# Patient Record
Sex: Male | Born: 1995 | Race: White | Hispanic: No | Marital: Single | Smoking: Current every day smoker
Health system: Southern US, Community
[De-identification: ages and names within clinical notes are randomized; demographics above are authoritative.]

---
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
## 2016-06-07 ENCOUNTER — Emergency Department (HOSPITAL_COMMUNITY)
Admission: EM | Admit: 2016-06-07 | Discharge: 2016-06-07 | Disposition: A | Discharge status: Home / Self Care | Payer: 59 | Attending: Emergency Medicine | Admitting: Emergency Medicine

## 2016-06-07 ENCOUNTER — Encounter (HOSPITAL_COMMUNITY): Payer: Self-pay | Admitting: *Deleted

## 2016-06-07 ENCOUNTER — Emergency Department (HOSPITAL_COMMUNITY): Payer: 59

## 2016-06-07 DIAGNOSIS — Z79899 Other long term (current) drug therapy: Secondary | ICD-10-CM | POA: Insufficient documentation

## 2016-06-07 DIAGNOSIS — F909 Attention-deficit hyperactivity disorder, unspecified type: Secondary | ICD-10-CM | POA: Insufficient documentation

## 2016-06-07 DIAGNOSIS — J45909 Unspecified asthma, uncomplicated: Secondary | ICD-10-CM | POA: Diagnosis not present

## 2016-06-07 DIAGNOSIS — S61211A Laceration without foreign body of left index finger without damage to nail, initial encounter: Secondary | ICD-10-CM | POA: Insufficient documentation

## 2016-06-07 DIAGNOSIS — Y929 Unspecified place or not applicable: Secondary | ICD-10-CM | POA: Diagnosis not present

## 2016-06-07 DIAGNOSIS — M79645 Pain in left finger(s): Secondary | ICD-10-CM

## 2016-06-07 DIAGNOSIS — Y999 Unspecified external cause status: Secondary | ICD-10-CM | POA: Diagnosis not present

## 2016-06-07 DIAGNOSIS — S61219A Laceration without foreign body of unspecified finger without damage to nail, initial encounter: Secondary | ICD-10-CM

## 2016-06-07 DIAGNOSIS — Y939 Activity, unspecified: Secondary | ICD-10-CM | POA: Diagnosis not present

## 2016-06-07 DIAGNOSIS — W311XXA Contact with metalworking machines, initial encounter: Secondary | ICD-10-CM | POA: Insufficient documentation

## 2016-06-07 MED ORDER — DOXYCYCLINE HYCLATE 100 MG PO CAPS: 100.0000 mg | ORAL_CAPSULE | Freq: Two times a day (BID) | ORAL | 0 refills | Status: DC

## 2016-06-07 NOTE — ED Triage Notes (Signed)
Pt reports cutting left index finger on hedger two days ago, then tried to superglue his finger at home. Now still has swelling and pain to finger, no redness or warmth noted.

## 2016-06-07 NOTE — ED Notes (Signed)
Pt has superficial laceration to left index finger with hedge trimmer, 2 days ago. States he "glued it together with superglue".

## 2016-06-07 NOTE — ED Provider Notes (Signed)
[facility] Provider Note   CSN: [id number] Arrival date & time: 06/07/16  1605  By signing my name below, I, Clovis Pu, attest that this documentation has been prepared under the direction and in the presence of  Fordyce Lepak Camprubi-Soms, PA-C. Electronically Signed: Clovis Pu, ED Scribe. 06/07/16. 5:00 PM.    History   Chief Complaint Chief Complaint  Patient presents with  . Laceration    The history is provided by the patient and medical records. No language interpreter was used.  Laceration   The incident occurred 2 days ago. The laceration is located on the left hand. The laceration is 1 cm in size. The laceration mechanism was a a metal edge. The pain is at a severity of 7/10. The pain is mild. The pain has been intermittent since onset. He reports no foreign bodies present. His tetanus status is UTD.   HPI Comments:  Dustin Kline is a 20 y.o. male who presents to the Emergency Department complaining of a L index finger lac that he sustained 2 days ago on an Publishing rights manager (metal edge). He complaints of pain, which he describes as 7/10 intermittent, throbbing nonradiating L index finger pain worse with extending his knuckle and relieved by Advil. He superglued it initially, but today it spread open a little and has oozed some minimal blood from the wound only when he spreads it open. Associated symptoms include swelling and bruising to his finger. He denies redness, warmth, wound drainage, fevers, chills, CP, SOB, abd pain, N/V/D/C, hematuria, dysuria, myalgias, numbness, tingling, weakness, or any other symptoms.  Tetanus is UTD in last 3 yrs.   Past Medical History:  Diagnosis Date  . Asthma   . Attention deficit disorder     Patient Active Problem List   Diagnosis Date Noted  . ATTENTION DEFICIT, W/HYPERACTIVITY 11/14/2006  . RHINITIS, ALLERGIC 11/14/2006  . ASTHMA, UNSPECIFIED 11/14/2006    History reviewed. No pertinent surgical  history.     Home Medications    Prior to Admission medications   Medication Sig Start Date End Date Taking? Authorizing Provider  albuterol (PROVENTIL HFA;VENTOLIN HFA) 108 (90 BASE) MCG/ACT inhaler Inhale 2 puffs into the lungs every 6 (six) hours as needed for wheezing or shortness of breath. 10/27/13   Ethelda Chick, MD  ARIPiprazole (ABILIFY) 20 MG tablet Take one tablet by mouth daily 11/23/11   Pattricia Boss, PA-C  atomoxetine (STRATTERA) 40 MG capsule Take 1 capsule (40 mg total) by mouth daily. 10/28/11 10/27/12  Elvina Sidle, MD  azithromycin (ZITHROMAX) 250 MG tablet Take 2 tablets the first day and then 1 tablet daily for 4 days. 10/27/13   Ethelda Chick, MD  predniSONE (DELTASONE) 20 MG tablet Two tablets daily x 5 days, then one tablet daily x 3 days 10/27/13   Ethelda Chick, MD    Family History Family History  Problem Relation Age of Onset  . Arthritis Mother     Social History Social History  Substance Use Topics  . Smoking status: Never Smoker  . Smokeless tobacco: Never Used  . Alcohol use No     Allergies   Divalproex sodium   Review of Systems Review of Systems  Constitutional: Negative for chills and fever.  Respiratory: Negative for shortness of breath.   Cardiovascular: Negative for chest pain.  Gastrointestinal: Negative for abdominal pain, constipation, diarrhea, nausea and vomiting.  Genitourinary: Negative for dysuria.  Musculoskeletal: Positive for arthralgias and joint swelling. Negative for myalgias.  Skin: Positive for color change (bruised, no redness/warmth) and wound.  Allergic/Immunologic: Negative for immunocompromised state.  Neurological: Negative for weakness and numbness.  Psychiatric/Behavioral: Negative for confusion.  10 Systems reviewed and are negative for acute change except as noted in the HPI.    Physical Exam Updated Vital Signs BP 127/78   Pulse 78   Temp 98.3 F (36.8 C)   Resp 20   Wt 231 lb 3 oz (104.9  kg)   SpO2 99%   Physical Exam  Constitutional: He is oriented to person, place, and time. Vital signs are normal. He appears well-developed and well-nourished.  Non-toxic appearance. No distress.  Afebrile, nontoxic, NAD  HENT:  Head: Normocephalic and atraumatic.  Mouth/Throat: Mucous membranes are normal.  Eyes: Conjunctivae and EOM are normal. Right eye exhibits no discharge. Left eye exhibits no discharge.  Neck: Normal range of motion. Neck supple.  Cardiovascular: Normal rate and intact distal pulses.   Pulmonary/Chest: Effort normal. No respiratory distress.  Abdominal: Normal appearance. He exhibits no distension.  Musculoskeletal: Normal range of motion.       Left hand: He exhibits tenderness, bony tenderness, laceration and swelling. He exhibits normal range of motion, normal capillary refill and no deformity. Normal sensation noted. Normal strength noted.  L index finger with mild TTP to the PIP joint, mild swelling and bruising to this area, with FROM intact at MCP/PIP/DIP joints, with superficial ~1cm linear laceration to the volar aspect of the PIP joint, no ongoing bleeding, no drianage, no surrounding erythema or warmth. No deformity or crepitus. Sensation and strength grossly intact, distal pulses intact, cap refill brisk and present. SEE PICTURE BELOW.   Neurological: He is alert and oriented to person, place, and time. He has normal strength. No sensory deficit.  Skin: Skin is dry. Laceration noted. No rash noted.  Left index finger laceration as noted above and pictured below.   Psychiatric: He has a normal mood and affect.  Nursing note and vitals reviewed.      ED Treatments / Results  DIAGNOSTIC STUDIES:  Oxygen Saturation is 99% on RA, normal by my interpretation.    COORDINATION OF CARE:  4:40 PM Discussed treatment plan with pt at bedside and pt agreed to plan.  Labs (all labs ordered are listed, but only abnormal results are displayed) Labs Reviewed  - No data to display  EKG  EKG Interpretation None       Radiology Dg Finger Index Left  Result Date: 06/07/2016 CLINICAL DATA:  Pain and swelling after laceration 2 days prior EXAM: LEFT INDEX FINGER 2+V COMPARISON:  None. FINDINGS: Frontal, oblique, and lateral views were obtained. There is mild generalized soft tissue swelling. No radiopaque foreign body. No fracture or dislocation. No erosive change or bony destruction. No evidence radiopaque foreign body. IMPRESSION: Mild soft tissue swelling. No radiopaque foreign body. No bony destruction. No fracture or dislocation. No apparent arthropathy. Electronically Signed   By: Bretta Bang III M.D.   On: 06/07/2016 17:11    Procedures Procedures (including critical care time)  Medications Ordered in ED Medications - No data to display   Initial Impression / Assessment and Plan / ED Course  I have reviewed the triage vital signs and the nursing notes.  Pertinent labs & imaging results that were available during my care of the patient were reviewed by me and considered in my medical decision making (see chart for details).  Clinical Course    20 y.o. male here with L index  finger lac x2 days that he super glued, and now has some pain/swelling. No erythema/warmth, no evidence of developing cellulitis, wound slightly opened but no ongoing bleeding, and fairly superficial. FROM intact at all digits, NVI with soft compartments, mild tenderness to PIP joint. Mild swelling and bruising. Will obtain xray to ensure no bony injury. Tdap up to date. Will reassess after xray, but at this time it's too late to perform any wound closures, and the wound is actually healing well on its own. Will likely just d/c home with abx after ensuring no fx.   5:14 PM Xray neg for fx or FBs. Will d/c home with abx to cover for open wound with delayed secondary intent healing, discussed wound care, buddy taping, and RICE, and tylenol/motrin for pain. F/up  with PCP in 3-4 days for recheck of symptoms. I explained the diagnosis and have given explicit precautions to return to the ER including for any other new or worsening symptoms. The patient understands and accepts the medical plan as it's been dictated and I have answered their questions. Discharge instructions concerning home care and prescriptions have been given. The patient is STABLE and is discharged to home in good condition.   Final Clinical Impressions(s) / ED Diagnoses   Final diagnoses:  Laceration of finger, initial encounter  Pain of finger of left hand    New Prescriptions New Prescriptions   DOXYCYCLINE (VIBRAMYCIN) 100 MG CAPSULE    Take 1 capsule (100 mg total) by mouth 2 (two) times daily. One po bid x 7 days   I personally performed the services described in this documentation, which was scribed in my presence. The recorded information has been reviewed and is accurate.    Lyndee Herbst Camprubi-Soms, PA-C 06/07/16 1715    Jerelyn ScottMartha Linker, MD 06/07/16 [phone]

## 2016-06-07 NOTE — Discharge Instructions (Signed)
Keep wound clean with mild soap and water. Keep area covered with a topical antibiotic ointment and bandage, keep bandage dry. Buddy tape your fingers for comfort. Take antibiotic as directed until completed. Ice and elevate for additional pain relief and swelling. Alternate between Ibuprofen and Tylenol for additional pain relief. Follow up with your primary care doctor or the [facility] in approximately 3-4 days for wound recheck and recheck of symptoms. Monitor area for signs of infection to include, but not limited to: increasing pain, spreading redness, drainage/pus, worsening swelling, or fevers. Return to emergency department for emergent changing or worsening symptoms.

## 2016-12-23 IMAGING — DX DG FINGER INDEX 2+V*L*
4 series · 4 of 4 positions shown · non-contrast
Comparison: None.

CLINICAL DATA: Pain and swelling after laceration 2 days prior

EXAM:
LEFT INDEX FINGER 2+V

[finger ap]
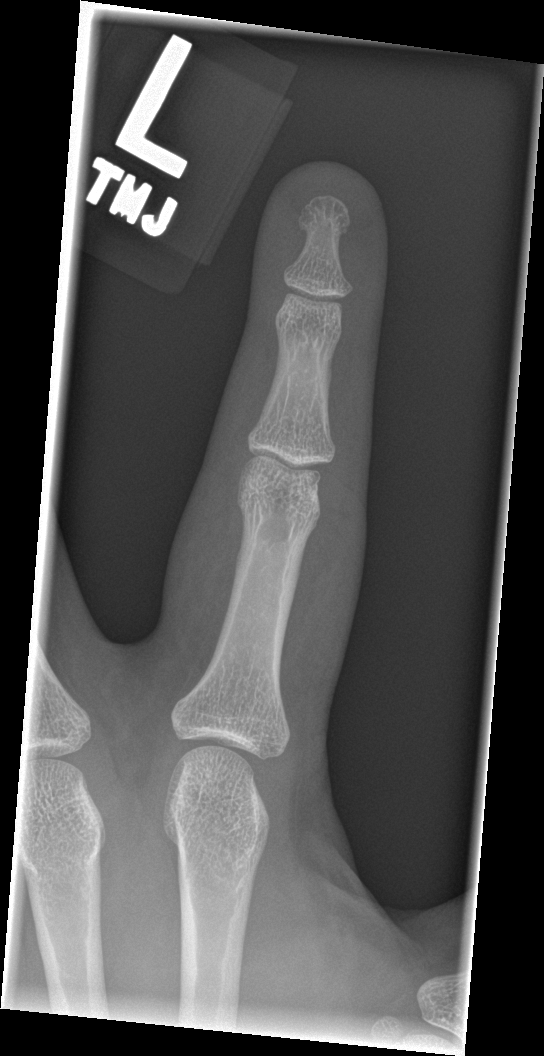

[finger obl (1 of 2)]
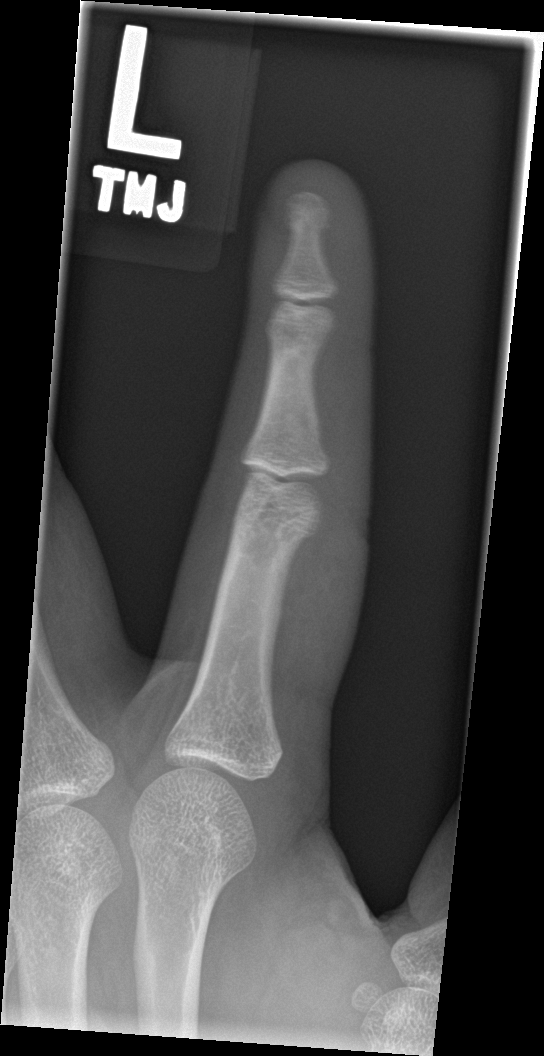

[finger lat]
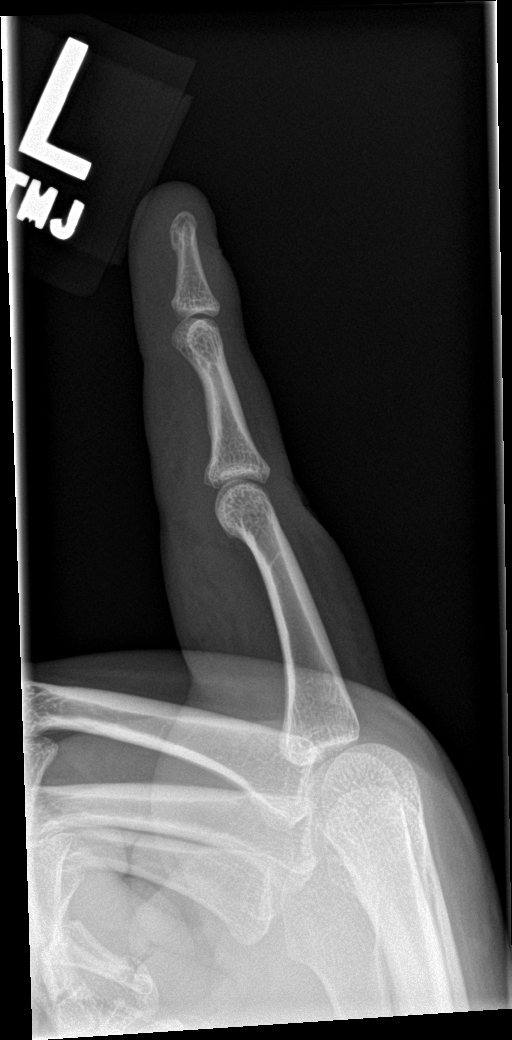

[finger obl (2 of 2)]
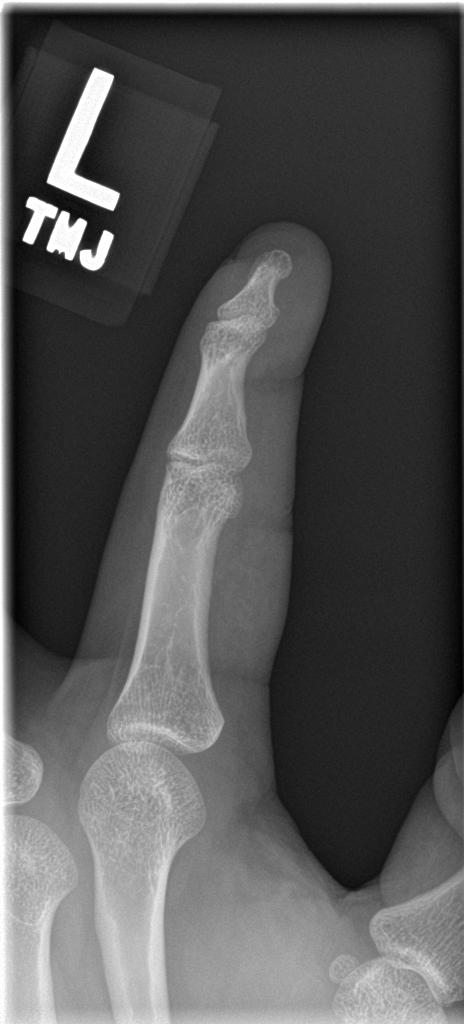

[4 of 4 positions shown; findings below may reference images not displayed]

FINDINGS: Frontal, oblique, and lateral views were obtained. There is mild
generalized soft tissue swelling. No radiopaque foreign body. No
fracture or dislocation. No erosive change or bony destruction. No
evidence radiopaque foreign body.
IMPRESSION: Mild soft tissue swelling. No radiopaque foreign body. No bony
destruction. No fracture or dislocation. No apparent arthropathy.

## 2018-07-30 ENCOUNTER — Other Ambulatory Visit: Payer: Self-pay

## 2018-07-30 ENCOUNTER — Encounter (HOSPITAL_COMMUNITY): Payer: Self-pay

## 2018-07-30 ENCOUNTER — Ambulatory Visit (HOSPITAL_COMMUNITY)
Admission: EM | Admit: 2018-07-30 | Discharge: 2018-07-30 | Disposition: A | Discharge status: Home / Self Care | Payer: 59 | Attending: Family Medicine | Admitting: Family Medicine

## 2018-07-30 DIAGNOSIS — J209 Acute bronchitis, unspecified: Secondary | ICD-10-CM | POA: Diagnosis not present

## 2018-07-30 DIAGNOSIS — J40 Bronchitis, not specified as acute or chronic: Secondary | ICD-10-CM | POA: Diagnosis not present

## 2018-07-30 MED ORDER — AZITHROMYCIN 250 MG PO TABS: 250.0000 mg | ORAL_TABLET | Freq: Every day | ORAL | 0 refills | Status: DC

## 2018-07-30 MED ORDER — ALBUTEROL SULFATE HFA 108 (90 BASE) MCG/ACT IN AERS
2.0000 | INHALATION_SPRAY | Freq: Once | RESPIRATORY_TRACT | Status: AC
  Administered 2018-07-30: 2 via RESPIRATORY_TRACT

## 2018-07-30 MED ORDER — ALBUTEROL SULFATE HFA 108 (90 BASE) MCG/ACT IN AERS
INHALATION_SPRAY | RESPIRATORY_TRACT | Status: AC
  Filled 2018-07-30: qty 6.7

## 2018-07-30 MED ORDER — PREDNISONE 50 MG PO TABS: 50.0000 mg | ORAL_TABLET | Freq: Every day | ORAL | 0 refills | Status: DC

## 2018-07-30 MED ORDER — IPRATROPIUM-ALBUTEROL 0.5-2.5 (3) MG/3ML IN SOLN
3.0000 mL | Freq: Once | RESPIRATORY_TRACT | Status: AC
  Administered 2018-07-30: 3 mL via RESPIRATORY_TRACT

## 2018-07-30 MED ORDER — IPRATROPIUM-ALBUTEROL 0.5-2.5 (3) MG/3ML IN SOLN
RESPIRATORY_TRACT | Status: AC
  Filled 2018-07-30: qty 3

## 2018-07-30 NOTE — ED Triage Notes (Signed)
Pt states he a cough cold congestion x 1 week or more.

## 2018-07-30 NOTE — ED Provider Notes (Signed)
[facility]    CSN: [id number] Arrival date & time: 07/30/18  4098     History   Chief Complaint Chief Complaint  Patient presents with  . Cough    HPI Dustin Kline is a 22 y.o. male.   22 year old male comes in for 1 to 2-week history of URI symptoms.  States started with cough, rhinorrhea, nasal congestion and symptoms has been gradually worsening.  Denies fever, chills, night sweats.  States history of asthma but "grew out of it", and has not needed to use an inhaler.  However, has been wheezing intermittently.  He has not been taking anything for the symptoms.  Current everyday smoker.     Past Medical History:  Diagnosis Date  . Asthma   . Attention deficit disorder     Patient Active Problem List   Diagnosis Date Noted  . ATTENTION DEFICIT, W/HYPERACTIVITY 11/14/2006  . RHINITIS, ALLERGIC 11/14/2006  . ASTHMA, UNSPECIFIED 11/14/2006    History reviewed. No pertinent surgical history.     Home Medications    Prior to Admission medications   Medication Sig Start Date End Date Taking? Authorizing Provider  albuterol (PROVENTIL HFA;VENTOLIN HFA) 108 (90 BASE) MCG/ACT inhaler Inhale 2 puffs into the lungs every 6 (six) hours as needed for wheezing or shortness of breath. 10/27/13   Ethelda Chick, MD  ARIPiprazole (ABILIFY) 20 MG tablet Take one tablet by mouth daily 11/23/11   Pattricia Boss, PA-C  atomoxetine (STRATTERA) 40 MG capsule Take 1 capsule (40 mg total) by mouth daily. 10/28/11 10/27/12  Elvina Sidle, MD  azithromycin (ZITHROMAX) 250 MG tablet Take 1 tablet (250 mg total) by mouth daily. Take first 2 tablets together, then 1 every day until finished. 07/30/18   Cathie Hoops, Theophil Thivierge V, PA-C  predniSONE (DELTASONE) 50 MG tablet Take 1 tablet (50 mg total) by mouth daily. 07/30/18   Belinda Fisher, PA-C    Family History Family History  Problem Relation Age of Onset  . Arthritis Mother     Social History Social History   Tobacco Use  .  Smoking status: Current Every Day Smoker    Types: Cigarettes  . Smokeless tobacco: Current User  Substance Use Topics  . Alcohol use: No  . Drug use: No     Allergies   Divalproex sodium   Review of Systems Review of Systems  Reason unable to perform ROS: See HPI as above.     Physical Exam Triage Vital Signs ED Triage Vitals  Enc Vitals Group     BP --      Pulse Rate 07/30/18 0813 93     Resp 07/30/18 0813 16     Temp 07/30/18 0813 98.7 F (37.1 C)     Temp Source 07/30/18 0813 Oral     SpO2 07/30/18 0813 100 %     Weight 07/30/18 0814 200 lb (90.7 kg)     Height --      Head Circumference --      Peak Flow --      Pain Score 07/30/18 0813 7     Pain Loc --      Pain Edu? --      Excl. in GC? --    No data found.  Updated Vital Signs Pulse 93   Temp 98.7 F (37.1 C) (Oral)   Resp 16   Wt 200 lb (90.7 kg)   SpO2 100%   Physical Exam  Constitutional: He is oriented to person,  place, and time. He appears well-developed and well-nourished. No distress.  HENT:  Head: Normocephalic and atraumatic.  Right Ear: Tympanic membrane, external ear and ear canal normal. Tympanic membrane is not erythematous and not bulging.  Left Ear: Tympanic membrane, external ear and ear canal normal. Tympanic membrane is not erythematous and not bulging.  Nose: Nose normal. Right sinus exhibits no maxillary sinus tenderness and no frontal sinus tenderness. Left sinus exhibits no maxillary sinus tenderness and no frontal sinus tenderness.  Mouth/Throat: Uvula is midline, oropharynx is clear and moist and mucous membranes are normal.  Eyes: Pupils are equal, round, and reactive to light. Conjunctivae are normal.  Neck: Normal range of motion. Neck supple.  Cardiovascular: Normal rate, regular rhythm and normal heart sounds. Exam reveals no gallop and no friction rub.  No murmur heard. Pulmonary/Chest: Effort normal. No accessory muscle usage. No respiratory distress.  Speaking in  full sentences without difficulty.  Diffuse inspiratory and expiratory wheezing and rhonchi, decreased air movement.  Lymphadenopathy:    He has no cervical adenopathy.  Neurological: He is alert and oriented to person, place, and time.  Skin: Skin is warm and dry.  Psychiatric: He has a normal mood and affect. His behavior is normal. Judgment normal.     UC Treatments / Results  Labs (all labs ordered are listed, but only abnormal results are displayed) Labs Reviewed - No data to display  EKG None  Radiology No results found.  Procedures Procedures (including critical care time)  Medications Ordered in UC Medications  albuterol (PROVENTIL HFA;VENTOLIN HFA) 108 (90 Base) MCG/ACT inhaler 2 puff (has no administration in time range)  ipratropium-albuterol (DUONEB) 0.5-2.5 (3) MG/3ML nebulizer solution 3 mL (3 mLs Nebulization Given 07/30/18 0842)    Initial Impression / Assessment and Plan / UC Course  I have reviewed the triage vital signs and the nursing notes.  Pertinent labs & imaging results that were available during my care of the patient were reviewed by me and considered in my medical decision making (see chart for details).    Patient without much improvement of symptoms after duoneb x 1. Better air movement on exam, with expiratory wheezing and rhonchi. Patient would like to defer duoneb x 2 due to time restraints. Will provide albuterol inhaler in office, and have patient start as directed. Prednisone and azithromycin as directed. Other symptomatic treatment discussed. Return precautions given. Patient expresses understanding and agrees to plan.  Final Clinical Impressions(s) / UC Diagnoses   Final diagnoses:  Bronchitis    ED Prescriptions    Medication Sig Dispense Auth. Provider   predniSONE (DELTASONE) 50 MG tablet Take 1 tablet (50 mg total) by mouth daily. 5 tablet Juwann Sherk V, PA-C   azithromycin (ZITHROMAX) 250 MG tablet Take 1 tablet (250 mg total) by  mouth daily. Take first 2 tablets together, then 1 every day until finished. 6 tablet Threasa AlphaYu, Latima Hamza V, PA-C        Cassiel Fernandez V, [place]PA-C 07/30/18 [phone]

## 2018-07-30 NOTE — Discharge Instructions (Signed)
Start prednisone and azithromycin as directed.  Please restart your albuterol inhaler every 4-6 hours as needed for shortness of breath and wheezing.  Please try to avoid smoking until symptoms improve.  You can use over the counter flonase, zyrtec-D for nasal congestion/drainage. You can use over the counter nasal saline rinse such as neti pot for nasal congestion. Keep hydrated, your urine should be clear to pale yellow in color. Tylenol/motrin for fever and pain. Monitor for any worsening of symptoms, chest pain, shortness of breath, wheezing, swelling of the throat, follow up for reevaluation.   For sore throat/cough try using a honey-based tea. Use 3 teaspoons of honey with juice squeezed from half lemon. Place shaved pieces of ginger into 1/2-1 cup of water and warm over stove top. Then mix the ingredients and repeat every 4 hours as needed.

## 2020-12-28 ENCOUNTER — Encounter (HOSPITAL_COMMUNITY): Payer: Self-pay

## 2020-12-28 ENCOUNTER — Ambulatory Visit (HOSPITAL_COMMUNITY)
Admission: EM | Admit: 2020-12-28 | Discharge: 2020-12-28 | Disposition: A | Discharge status: Home / Self Care | Payer: 59 | Attending: Emergency Medicine | Admitting: Emergency Medicine

## 2020-12-28 ENCOUNTER — Other Ambulatory Visit: Payer: Self-pay

## 2020-12-28 DIAGNOSIS — J209 Acute bronchitis, unspecified: Secondary | ICD-10-CM | POA: Diagnosis not present

## 2020-12-28 MED ORDER — IPRATROPIUM-ALBUTEROL 0.5-2.5 (3) MG/3ML IN SOLN: 3.0000 mL | Freq: Once | RESPIRATORY_TRACT | Status: DC

## 2020-12-28 MED ORDER — PREDNISONE 10 MG (21) PO TBPK: ORAL_TABLET | ORAL | 0 refills | Status: DC

## 2020-12-28 MED ORDER — ALBUTEROL SULFATE HFA 108 (90 BASE) MCG/ACT IN AERS: 1.0000 | INHALATION_SPRAY | RESPIRATORY_TRACT | 0 refills | Status: DC | PRN

## 2020-12-28 MED ORDER — BENZONATATE 100 MG PO CAPS: 100.0000 mg | ORAL_CAPSULE | Freq: Three times a day (TID) | ORAL | 0 refills | Status: DC

## 2020-12-28 NOTE — ED Provider Notes (Signed)
[facility]    CSN: [id number] Arrival date & time: 12/28/20  1515      History   Chief Complaint Chief Complaint  Patient presents with  . Cough  . Sore Throat    HPI Dustin Kline is a 25 y.o. male.   Patient presents with concerns of cough for the past 2 weeks. He reports the cough is productive and accompanied with wheezing and feeling more short of breath than usual with activity. He reports history of childhood asthma and states he gets pneumonia every year and thinks he has either pneumonia or bronchitis. The patient does smoke. He does not regularly use an inhaler, though he used an old one a couple times with mild improvement. He reports some nasal congestion and throat irritation but denies fever, headache, or body aches. He has been taking OTC medicine including DayQuil and NyQuil with minimal improvement.   The history is provided by the patient.    Past Medical History:  Diagnosis Date  . Asthma   . Attention deficit disorder     Patient Active Problem List   Diagnosis Date Noted  . ATTENTION DEFICIT, W/HYPERACTIVITY 11/14/2006  . RHINITIS, ALLERGIC 11/14/2006  . ASTHMA, UNSPECIFIED 11/14/2006    History reviewed. No pertinent surgical history.     Home Medications    Prior to Admission medications   Medication Sig Start Date End Date Taking? Authorizing Provider  albuterol (VENTOLIN HFA) 108 (90 Base) MCG/ACT inhaler Inhale 1-2 puffs into the lungs every 4 (four) hours as needed for wheezing or shortness of breath. 12/28/20  Yes Brigg Cape L, PA  benzonatate (TESSALON) 100 MG capsule Take 1 capsule (100 mg total) by mouth every 8 (eight) hours. 12/28/20  Yes Ephram Kornegay L, PA  predniSONE (STERAPRED UNI-PAK 21 TAB) 10 MG (21) TBPK tablet Take as directed 12/28/20  Yes Tiarah Shisler L, PA  ARIPiprazole (ABILIFY) 20 MG tablet Take one tablet by mouth daily 11/23/11   Pattricia Boss, PA-C  atomoxetine (STRATTERA) 40 MG capsule Take 1 capsule  (40 mg total) by mouth daily. 10/28/11 10/27/12  Elvina Sidle, MD    Family History Family History  Problem Relation Age of Onset  . Arthritis Mother     Social History Social History   Tobacco Use  . Smoking status: Current Every Day Smoker    Types: Cigarettes  . Smokeless tobacco: Current User  Substance Use Topics  . Alcohol use: No  . Drug use: No     Allergies   Divalproex sodium   Review of Systems Review of Systems  Constitutional: Negative for fatigue and fever.  HENT: Positive for congestion and sore throat. Negative for ear pain.   Respiratory: Positive for cough, chest tightness, shortness of breath and wheezing.   Cardiovascular: Negative for chest pain.  Gastrointestinal: Negative for diarrhea, nausea and vomiting.  Musculoskeletal: Negative for myalgias.  Skin: Negative for rash.  Neurological: Negative for dizziness and headaches.     Physical Exam Triage Vital Signs ED Triage Vitals  Enc Vitals Group     BP 12/28/20 1603 122/82     Pulse Rate 12/28/20 1602 83     Resp 12/28/20 1602 19     Temp 12/28/20 1602 98.9 F (37.2 C)     Temp src --      SpO2 12/28/20 1602 98 %     Weight --      Height --      Head Circumference --  Peak Flow --      Pain Score 12/28/20 1601 0     Pain Loc --      Pain Edu? --      Excl. in GC? --    No data found.  Updated Vital Signs BP 122/82   Pulse 83   Temp 98.9 F (37.2 C)   Resp 19   SpO2 98%   Visual Acuity Right Eye Distance:   Left Eye Distance:   Bilateral Distance:    Right Eye Near:   Left Eye Near:    Bilateral Near:     Physical Exam Vitals and nursing note reviewed.  Constitutional:      General: He is not in acute distress. HENT:     Head: Normocephalic.     Nose: Nose normal.     Mouth/Throat:     Mouth: Mucous membranes are moist.     Pharynx: Oropharynx is clear. No oropharyngeal exudate.  Eyes:     Conjunctiva/sclera: Conjunctivae normal.     Pupils: Pupils  are equal, round, and reactive to light.  Cardiovascular:     Rate and Rhythm: Normal rate and regular rhythm.     Heart sounds: Normal heart sounds.  Pulmonary:     Effort: Pulmonary effort is normal. No respiratory distress.     Breath sounds: No stridor. Wheezing and rales present.     Comments: Diffuse expiratory wheezing and inspiratory rales  Lymphadenopathy:     Cervical: No cervical adenopathy.  Skin:    General: Skin is warm.     Findings: No rash.  Neurological:     Mental Status: He is alert.  Psychiatric:        Mood and Affect: Mood normal.      UC Treatments / Results  Labs (all labs ordered are listed, but only abnormal results are displayed) Labs Reviewed - No data to display  EKG   Radiology No results found.  Procedures Procedures (including critical care time)  Medications Ordered in UC Medications - No data to display  Initial Impression / Assessment and Plan / UC Course  I have reviewed the triage vital signs and the nursing notes.  Pertinent labs & imaging results that were available during my care of the patient were reviewed by me and considered in my medical decision making (see chart for details).     Symptoms consistent with bronchitis over pneumonia, especially given recurrent history. Nebulizers not currently performed, so inhaler prescribed. CXR initially ordered to rule out pneumonia but patient was unable to wait as he had to get to work. Will treat as bronchitis and discussed that if minimal improvement or if any worsening to return to care for further work-up and he expressed understanding.   Final Clinical Impressions(s) / UC Diagnoses   Final diagnoses:  Acute bronchitis, unspecified organism     Discharge Instructions     Take prednisone as prescribed as well as use inhaler as prescribed as needed to help with chest tightness and wheezing. Take Tessalon as prescribed for cough. Return to care if no improvement in a week or  if worsening. Recommend follow up with PCP given recurrent lung problems.     ED Prescriptions    Medication Sig Dispense Auth. Provider   predniSONE (STERAPRED UNI-PAK 21 TAB) 10 MG (21) TBPK tablet Take as directed 1 each Stokes Rattigan L, PA   albuterol (VENTOLIN HFA) 108 (90 Base) MCG/ACT inhaler Inhale 1-2 puffs into the lungs every 4 (four)  hours as needed for wheezing or shortness of breath. 1 each Vallery Sa, Jospeh Mangel L, PA   benzonatate (TESSALON) 100 MG capsule Take 1 capsule (100 mg total) by mouth every 8 (eight) hours. 21 capsule Vallery Sa, Goebel Hellums L, PA     PDMP not reviewed this encounter.   Estanislado Pandy, [place] 12/28/20 1711

## 2020-12-28 NOTE — ED Triage Notes (Signed)
Pt in with c/o productive cough and St that has been going on for 2 weeks  Pt has been taking dayquil/nyquil and mucinex with no relief

## 2020-12-28 NOTE — Discharge Instructions (Addendum)
Take prednisone as prescribed as well as use inhaler as prescribed as needed to help with chest tightness and wheezing. Take Tessalon as prescribed for cough. Return to care if no improvement in a week or if worsening. Recommend follow up with PCP given recurrent lung problems.

## 2021-05-03 ENCOUNTER — Encounter (HOSPITAL_COMMUNITY): Payer: Self-pay

## 2021-05-03 ENCOUNTER — Ambulatory Visit (HOSPITAL_COMMUNITY)
Admission: EM | Admit: 2021-05-03 | Discharge: 2021-05-03 | Disposition: A | Discharge status: Home / Self Care | Payer: 59 | Attending: Student | Admitting: Student

## 2021-05-03 ENCOUNTER — Other Ambulatory Visit: Payer: Self-pay

## 2021-05-03 DIAGNOSIS — J069 Acute upper respiratory infection, unspecified: Secondary | ICD-10-CM | POA: Diagnosis not present

## 2021-05-03 MED ORDER — LIDOCAINE VISCOUS HCL 2 % MT SOLN: 15.0000 mL | OROMUCOSAL | 0 refills | Status: DC | PRN

## 2021-05-03 MED ORDER — PROMETHAZINE-DM 6.25-15 MG/5ML PO SYRP: 5.0000 mL | ORAL_SOLUTION | Freq: Four times a day (QID) | ORAL | 0 refills | Status: DC | PRN

## 2021-05-03 NOTE — ED Triage Notes (Signed)
Pt presents with chills, generalized body aches and headache X 2 days.

## 2021-05-03 NOTE — Discharge Instructions (Addendum)
-  For sore throat, use lidocaine mouthwash up to every 4 hours. Make sure not to eat for at least 1 hour after using this, as your mouth will be very numb and you could bite yourself. -Promethazine DM cough syrup for congestion/cough. This could make you drowsy, so take at night before bed. -For fevers/chills, bodyaches, headaches- -You can take Tylenol up to 1000 mg 3 times daily, and ibuprofen up to 800 mg 3 times daily with food.  You can take these together, or alternate every 3-4 hours. -With a virus, you're typically contagious for 5-7 days, or as long as you're having fevers.

## 2021-05-03 NOTE — ED Provider Notes (Signed)
[facility]    CSN: [id number] Arrival date & time: 05/03/21  1427      History   Chief Complaint Chief Complaint  Patient presents with   URI   Generalized Body Aches    HPI Dustin Kline is a 25 y.o. male presenting with viral symptoms for 2 days.  Medical history allergies, asthma.  Asthma is well controlled on no inhalers.  Notes generalized body aches, sore throat, subjective chills, nonproductive cough.  Denies wheezing, shortness of breath. Denies n/v/d, shortness of breath, chest pain, facial pain, teeth pain, headaches, loss of taste/smell, swollen lymph nodes, ear pain.    HPI  Past Medical History:  Diagnosis Date   Asthma    Attention deficit disorder     Patient Active Problem List   Diagnosis Date Noted   ATTENTION DEFICIT, W/HYPERACTIVITY 11/14/2006   RHINITIS, ALLERGIC 11/14/2006   ASTHMA, UNSPECIFIED 11/14/2006    History reviewed. No pertinent surgical history.     Home Medications    Prior to Admission medications   Medication Sig Start Date End Date Taking? Authorizing Provider  lidocaine (XYLOCAINE) 2 % solution Use as directed 15 mLs in the mouth or throat as needed for mouth pain. 05/03/21  Yes Rhys Martini, PA-C  promethazine-dextromethorphan (PROMETHAZINE-DM) 6.25-15 MG/5ML syrup Take 5 mLs by mouth 4 (four) times daily as needed for cough. 05/03/21  Yes Rhys Martini, PA-C  albuterol (VENTOLIN HFA) 108 (90 Base) MCG/ACT inhaler Inhale 1-2 puffs into the lungs every 4 (four) hours as needed for wheezing or shortness of breath. 12/28/20   Vallery Sa, Amy L, PA  ARIPiprazole (ABILIFY) 20 MG tablet Take one tablet by mouth daily 11/23/11   Pattricia Boss, PA-C  atomoxetine (STRATTERA) 40 MG capsule Take 1 capsule (40 mg total) by mouth daily. 10/28/11 10/27/12  Elvina Sidle, MD  benzonatate (TESSALON) 100 MG capsule Take 1 capsule (100 mg total) by mouth every 8 (eight) hours. 12/28/20   Estanislado Pandy, PA    Family  History Family History  Problem Relation Age of Onset   Arthritis Mother     Social History Social History   Tobacco Use   Smoking status: Every Day    Types: Cigarettes   Smokeless tobacco: Current  Substance Use Topics   Alcohol use: No   Drug use: No     Allergies   Divalproex sodium   Review of Systems Review of Systems  Constitutional:  Negative for appetite change, chills and fever.  HENT:  Positive for congestion and sore throat. Negative for ear pain, rhinorrhea, sinus pressure and sinus pain.   Eyes:  Negative for redness and visual disturbance.  Respiratory:  Positive for cough. Negative for chest tightness, shortness of breath and wheezing.   Cardiovascular:  Negative for chest pain and palpitations.  Gastrointestinal:  Negative for abdominal pain, constipation, diarrhea, nausea and vomiting.  Genitourinary:  Negative for dysuria, frequency and urgency.  Musculoskeletal:  Negative for myalgias.  Neurological:  Negative for dizziness, weakness and headaches.  Psychiatric/Behavioral:  Negative for confusion.   All other systems reviewed and are negative.   Physical Exam Triage Vital Signs ED Triage Vitals [05/03/21 1501]  Enc Vitals Group     BP 117/69     Pulse Rate (!) 106     Resp 18     Temp 99.1 F (37.3 C)     Temp Source Oral     SpO2 97 %     Weight  Height      Head Circumference      Peak Flow      Pain Score 6     Pain Loc      Pain Edu?      Excl. in GC?    No data found.  Updated Vital Signs BP 117/69 (BP Location: Right Arm)   Pulse (!) 106   Temp 99.1 F (37.3 C) (Oral)   Resp 18   SpO2 97%   Visual Acuity Right Eye Distance:   Left Eye Distance:   Bilateral Distance:    Right Eye Near:   Left Eye Near:    Bilateral Near:     Physical Exam Vitals reviewed.  Constitutional:      General: He is not in acute distress.    Appearance: Normal appearance. He is not ill-appearing.  HENT:     Head: Normocephalic and  atraumatic.     Right Ear: Tympanic membrane, ear canal and external ear normal. No tenderness. No middle ear effusion. There is no impacted cerumen. Tympanic membrane is not perforated, erythematous, retracted or bulging.     Left Ear: Tympanic membrane, ear canal and external ear normal. No tenderness.  No middle ear effusion. There is no impacted cerumen. Tympanic membrane is not perforated, erythematous, retracted or bulging.     Nose: Nose normal. No congestion.     Mouth/Throat:     Mouth: Mucous membranes are moist.     Pharynx: Uvula midline. No oropharyngeal exudate or posterior oropharyngeal erythema.  Eyes:     Extraocular Movements: Extraocular movements intact.     Pupils: Pupils are equal, round, and reactive to light.  Cardiovascular:     Rate and Rhythm: Regular rhythm. Tachycardia present.     Heart sounds: Normal heart sounds.  Pulmonary:     Effort: Pulmonary effort is normal.     Breath sounds: Normal breath sounds. No decreased breath sounds, wheezing, rhonchi or rales.  Abdominal:     Palpations: Abdomen is soft.     Tenderness: There is no abdominal tenderness. There is no guarding or rebound.  Neurological:     General: No focal deficit present.     Mental Status: He is alert and oriented to person, place, and time.  Psychiatric:        Mood and Affect: Mood normal.        Behavior: Behavior normal.        Thought Content: Thought content normal.        Judgment: Judgment normal.     UC Treatments / Results  Labs (all labs ordered are listed, but only abnormal results are displayed) Labs Reviewed - No data to display   EKG   Radiology No results found.  Procedures Procedures (including critical care time)  Medications Ordered in UC Medications - No data to display  Initial Impression / Assessment and Plan / UC Course  I have reviewed the triage vital signs and the nursing notes.  Pertinent labs & imaging results that were available during my  care of the patient were reviewed by me and considered in my medical decision making (see chart for details).     This patient is a very pleasant 25 y.o. year old male presenting with viral URI. Afebrile but tachycardic. No wheezes rhonchi rales, oxygenating well room air. History childhood asthma and recurrent bronchitis, last diagnosed with this 12/2020. Well controlled on no inhalers, benign pulm exam today.  Negative home covid test, declines additional  testing today.  Viscous lidocaine, promethazine DM.  ED return precautions discussed. Patient verbalizes understanding and agreement.   Coding Level 4 for acute illness with systemic symptoms, and prescription drug management  Final Clinical Impressions(s) / UC Diagnoses   Final diagnoses:  Viral URI with cough     Discharge Instructions      -For sore throat, use lidocaine mouthwash up to every 4 hours. Make sure not to eat for at least 1 hour after using this, as your mouth will be very numb and you could bite yourself. -Promethazine DM cough syrup for congestion/cough. This could make you drowsy, so take at night before bed. -For fevers/chills, bodyaches, headaches- -You can take Tylenol up to 1000 mg 3 times daily, and ibuprofen up to 800 mg 3 times daily with food.  You can take these together, or alternate every 3-4 hours. -With a virus, you're typically contagious for 5-7 days, or as long as you're having fevers.       ED Prescriptions     Medication Sig Dispense Auth. Provider   promethazine-dextromethorphan (PROMETHAZINE-DM) 6.25-15 MG/5ML syrup Take 5 mLs by mouth 4 (four) times daily as needed for cough. 118 mL Ignacia Bayley E, PA-C   lidocaine (XYLOCAINE) 2 % solution Use as directed 15 mLs in the mouth or throat as needed for mouth pain. 100 mL Rhys Martini, PA-C      PDMP not reviewed this encounter.   Rhys Martini, PA-C 05/03/21 1531

## 2021-11-13 ENCOUNTER — Encounter (HOSPITAL_COMMUNITY): Payer: Self-pay

## 2021-11-13 ENCOUNTER — Ambulatory Visit (HOSPITAL_COMMUNITY)
Admission: EM | Admit: 2021-11-13 | Discharge: 2021-11-13 | Disposition: A | Discharge status: Home / Self Care | Payer: 59 | Attending: Family Medicine | Admitting: Family Medicine

## 2021-11-13 ENCOUNTER — Other Ambulatory Visit: Payer: Self-pay

## 2021-11-13 DIAGNOSIS — J209 Acute bronchitis, unspecified: Secondary | ICD-10-CM | POA: Diagnosis not present

## 2021-11-13 MED ORDER — BENZONATATE 100 MG PO CAPS: 200.0000 mg | ORAL_CAPSULE | Freq: Three times a day (TID) | ORAL | 0 refills | Status: DC

## 2021-11-13 MED ORDER — AZITHROMYCIN 250 MG PO TABS: 250.0000 mg | ORAL_TABLET | Freq: Every day | ORAL | 0 refills | Status: DC

## 2021-11-13 NOTE — ED Provider Notes (Signed)
[facility]    CSN: [id number] Arrival date & time: 11/13/21  7017      History   Chief Complaint No chief complaint on file.   HPI Dustin Kline is a 26 y.o. male.   Patient is here for cough, coughing fits, coughing up mucous, going on x 1 month.  No fevers/chills.  Mild runny nose, congestion.  No ear pain, + sore throat.  Using otc meds without much help.  He ran out of his inhaler.  He has not noted any wheezing.   Past Medical History:  Diagnosis Date   Asthma    Attention deficit disorder     Patient Active Problem List   Diagnosis Date Noted   ATTENTION DEFICIT, W/HYPERACTIVITY 11/14/2006   RHINITIS, ALLERGIC 11/14/2006   ASTHMA, UNSPECIFIED 11/14/2006    History reviewed. No pertinent surgical history.     Home Medications    Prior to Admission medications   Medication Sig Start Date End Date Taking? Authorizing Provider  albuterol (VENTOLIN HFA) 108 (90 Base) MCG/ACT inhaler Inhale 1-2 puffs into the lungs every 4 (four) hours as needed for wheezing or shortness of breath. 12/28/20   Vallery Sa, Amy L, PA  ARIPiprazole (ABILIFY) 20 MG tablet Take one tablet by mouth daily 11/23/11   Pattricia Boss, PA-C  atomoxetine (STRATTERA) 40 MG capsule Take 1 capsule (40 mg total) by mouth daily. 10/28/11 10/27/12  Elvina Sidle, MD  benzonatate (TESSALON) 100 MG capsule Take 1 capsule (100 mg total) by mouth every 8 (eight) hours. 12/28/20   Vallery Sa, Amy L, PA  lidocaine (XYLOCAINE) 2 % solution Use as directed 15 mLs in the mouth or throat as needed for mouth pain. 05/03/21   Rhys Martini, PA-C  promethazine-dextromethorphan (PROMETHAZINE-DM) 6.25-15 MG/5ML syrup Take 5 mLs by mouth 4 (four) times daily as needed for cough. 05/03/21   Rhys Martini, PA-C    Family History Family History  Problem Relation Age of Onset   Arthritis Mother     Social History Social History   Tobacco Use   Smoking status: Every Day    Types: Cigarettes    Smokeless tobacco: Current  Substance Use Topics   Alcohol use: No   Drug use: No     Allergies   Divalproex sodium   Review of Systems Review of Systems  Constitutional: Negative.   HENT:  Positive for rhinorrhea.   Eyes: Negative.   Respiratory:  Positive for cough and shortness of breath.   Cardiovascular: Negative.   Gastrointestinal: Negative.     Physical Exam Triage Vital Signs ED Triage Vitals [11/13/21 0855]  Enc Vitals Group     BP 130/84     Pulse Rate 90     Resp 16     Temp 98.2 F (36.8 C)     Temp Source Oral     SpO2 97 %     Weight      Height      Head Circumference      Peak Flow      Pain Score      Pain Loc      Pain Edu?      Excl. in GC?    No data found.  Updated Vital Signs BP 130/84 (BP Location: Left Arm)    Pulse 90    Temp 98.2 F (36.8 C) (Oral)    Resp 16    SpO2 97%   Visual Acuity Right Eye Distance:   Left  Eye Distance:   Bilateral Distance:    Right Eye Near:   Left Eye Near:    Bilateral Near:     Physical Exam Constitutional:      Appearance: Normal appearance.  HENT:     Head: Normocephalic and atraumatic.     Nose: Nose normal.  Cardiovascular:     Rate and Rhythm: Normal rate and regular rhythm.  Pulmonary:     Effort: Pulmonary effort is normal.     Breath sounds: Rhonchi present.  Musculoskeletal:     Cervical back: Normal range of motion and neck supple. No tenderness.  Neurological:     Mental Status: He is alert.     UC Treatments / Results  Labs (all labs ordered are listed, but only abnormal results are displayed) Labs Reviewed - No data to display  EKG   Radiology No results found.  Procedures Procedures (including critical care time)  Medications Ordered in UC Medications - No data to display  Initial Impression / Assessment and Plan / UC Course  I have reviewed the triage vital signs and the nursing notes.  Pertinent labs & imaging results that were available during my care  of the patient were reviewed by me and considered in my medical decision making (see chart for details).     Final Clinical Impressions(s) / UC Diagnoses   Final diagnoses:  Acute bronchitis, unspecified organism     Discharge Instructions      You were seen today for bronchitis.  I have sent out an antibiotic and a cough pill to your pharmacy.  I also recommend getting plenty of rest and fluids.  Please follow up if not improving as expected.     ED Prescriptions     Medication Sig Dispense Auth. Provider   azithromycin (ZITHROMAX) 250 MG tablet Take 1 tablet (250 mg total) by mouth daily. Take first 2 tablets together, then 1 every day until finished. 6 tablet Hridhaan Yohn, MD   benzonatate (TESSALON) 100 MG capsule Take 2 capsules (200 mg total) by mouth every 8 (eight) hours. 21 capsule Jannifer Franklin, MD      PDMP not reviewed this encounter.   Jannifer Franklin, MD 11/13/21 [phone]

## 2021-11-13 NOTE — Discharge Instructions (Signed)
You were seen today for bronchitis.  I have sent out an antibiotic and a cough pill to your pharmacy.  I also recommend getting plenty of rest and fluids.  Please follow up if not improving as expected.

## 2021-11-13 NOTE — ED Triage Notes (Signed)
Pt presents to the office for cough and congestion x 1-2 weeks.  

## 2022-01-11 ENCOUNTER — Encounter (HOSPITAL_COMMUNITY): Payer: Self-pay

## 2022-01-11 ENCOUNTER — Ambulatory Visit (HOSPITAL_COMMUNITY)
Admission: EM | Admit: 2022-01-11 | Discharge: 2022-01-11 | Disposition: A | Discharge status: Home / Self Care | Payer: 59 | Attending: Family Medicine | Admitting: Family Medicine

## 2022-01-11 DIAGNOSIS — J209 Acute bronchitis, unspecified: Secondary | ICD-10-CM

## 2022-01-11 DIAGNOSIS — J4521 Mild intermittent asthma with (acute) exacerbation: Secondary | ICD-10-CM | POA: Diagnosis not present

## 2022-01-11 MED ORDER — AZITHROMYCIN 250 MG PO TABS: 250.0000 mg | ORAL_TABLET | Freq: Every day | ORAL | 0 refills | Status: DC

## 2022-01-11 MED ORDER — ALBUTEROL SULFATE HFA 108 (90 BASE) MCG/ACT IN AERS: 1.0000 | INHALATION_SPRAY | RESPIRATORY_TRACT | 0 refills | Status: DC | PRN

## 2022-01-11 MED ORDER — BENZONATATE 100 MG PO CAPS: 200.0000 mg | ORAL_CAPSULE | Freq: Three times a day (TID) | ORAL | 0 refills | Status: DC

## 2022-01-11 NOTE — ED Triage Notes (Signed)
Pt states cough, sore throat, runny  nose and congestion for the past week.  ?

## 2022-01-11 NOTE — ED Provider Notes (Signed)
?[facility] ? ? ? ?CSN: [id number] ?Arrival date & time: 01/11/22  0831 ? ? ?  ? ?History   ?Chief Complaint ?Chief Complaint  ?Patient presents with  ? Cough  ? ? ?HPI ?RONNY KORFF is a 26 y.o. male.  ? ?Patient is here for 1 week of cough, congestion, sore throat, runny nose, muscle fatigue.  No fevers.  ?No known sick contacts.  ?He has used nyquil without much help.  ?He has a h/o asthma as a kid, but does not have any inhalers at home today.  ? ?Past Medical History:  ?Diagnosis Date  ? Asthma   ? Attention deficit disorder   ? ? ?Patient Active Problem List  ? Diagnosis Date Noted  ? ATTENTION DEFICIT, W/HYPERACTIVITY 11/14/2006  ? RHINITIS, ALLERGIC 11/14/2006  ? ASTHMA, UNSPECIFIED 11/14/2006  ? ? ?History reviewed. No pertinent surgical history. ? ? ? ? ?Home Medications   ? ?Prior to Admission medications   ?Medication Sig Start Date End Date Taking? Authorizing Provider  ?albuterol (VENTOLIN HFA) 108 (90 Base) MCG/ACT inhaler Inhale 1-2 puffs into the lungs every 4 (four) hours as needed for wheezing or shortness of breath. 12/28/20   Estanislado Pandy, PA  ?ARIPiprazole (ABILIFY) 20 MG tablet Take one tablet by mouth daily 11/23/11   Pattricia Boss, PA-C  ?atomoxetine (STRATTERA) 40 MG capsule Take 1 capsule (40 mg total) by mouth daily. 10/28/11 10/27/12  Elvina Sidle, MD  ?azithromycin (ZITHROMAX) 250 MG tablet Take 1 tablet (250 mg total) by mouth daily. Take first 2 tablets together, then 1 every day until finished. 11/13/21   Jiah Bari, Denny Peon, MD  ?benzonatate (TESSALON) 100 MG capsule Take 2 capsules (200 mg total) by mouth every 8 (eight) hours. 11/13/21   Trinton Prewitt, Denny Peon, MD  ?lidocaine (XYLOCAINE) 2 % solution Use as directed 15 mLs in the mouth or throat as needed for mouth pain. 05/03/21   Rhys Martini, PA-C  ? ? ?Family History ?Family History  ?Problem Relation Age of Onset  ? Arthritis Mother   ? ? ?Social History ?Social History  ? ?Tobacco Use  ? Smoking status: Every Day  ?   Types: Cigarettes  ? Smokeless tobacco: Current  ?Substance Use Topics  ? Alcohol use: No  ? Drug use: No  ? ? ? ?Allergies   ?Divalproex sodium ? ? ?Review of Systems ?Review of Systems  ?Constitutional:  Positive for fatigue. Negative for chills and fever.  ?HENT:  Positive for congestion, rhinorrhea and sore throat.   ?Eyes: Negative.   ?Respiratory:  Positive for cough, shortness of breath and wheezing.   ?Gastrointestinal: Negative.   ?Genitourinary: Negative.   ? ? ?Physical Exam ?Triage Vital Signs ?ED Triage Vitals  ?Enc Vitals Group  ?   BP 01/11/22 1025 126/89  ?   Pulse Rate 01/11/22 1025 92  ?   Resp 01/11/22 1025 18  ?   Temp 01/11/22 1025 99.2 ?F (37.3 ?C)  ?   Temp Source 01/11/22 1025 Oral  ?   SpO2 01/11/22 1025 99 %  ?   Weight --   ?   Height --   ?   Head Circumference --   ?   Peak Flow --   ?   Pain Score 01/11/22 1026 0  ?   Pain Loc --   ?   Pain Edu? --   ?   Excl. in GC? --   ? ?No data found. ? ?Updated Vital Signs ?BP  126/89 (BP Location: Right Arm)   Pulse 92   Temp 99.2 ?F (37.3 ?C) (Oral)   Resp 18   SpO2 99%  ? ?Visual Acuity ?Right Eye Distance:   ?Left Eye Distance:   ?Bilateral Distance:   ? ?Right Eye Near:   ?Left Eye Near:    ?Bilateral Near:    ? ?Physical Exam ?Constitutional:   ?   Appearance: Normal appearance.  ?HENT:  ?   Head: Normocephalic and atraumatic.  ?   Right Ear: A middle ear effusion is present.  ?   Left Ear: A middle ear effusion is present.  ?   Nose:  ?   Right Sinus: No maxillary sinus tenderness or frontal sinus tenderness.  ?   Left Sinus: No maxillary sinus tenderness or frontal sinus tenderness.  ?   Mouth/Throat:  ?   Mouth: Mucous membranes are moist.  ?   Pharynx: Posterior oropharyngeal erythema present. No pharyngeal swelling or oropharyngeal exudate.  ?Pulmonary:  ?   Effort: Pulmonary effort is normal.  ?   Comments: A few scattered wheezes noted ?Neurological:  ?   Mental Status: He is alert.  ? ? ? ?UC Treatments / Results  ?Labs ?(all labs  ordered are listed, but only abnormal results are displayed) ?Labs Reviewed - No data to display ? ?EKG ? ? ?Radiology ?No results found. ? ?Procedures ?Procedures (including critical care time) ? ?Medications Ordered in UC ?Medications - No data to display ? ?Initial Impression / Assessment and Plan / UC Course  ?I have reviewed the triage vital signs and the nursing notes. ? ?Pertinent labs & imaging results that were available during my care of the patient were reviewed by me and considered in my medical decision making (see chart for details). ? ? ?Final Clinical Impressions(s) / UC Diagnoses  ? ?Final diagnoses:  ?Mild intermittent asthma with exacerbation  ?Acute bronchitis, unspecified organism  ? ? ? ?Discharge Instructions   ? ?  ?You were seen today for upper respiratory symptoms.  ?I have treated you today for bronchitis.  I have sent out an antibiotic, cough pill, and inhaler.  Please take these, and get plenty of rest and fluids.  ?Please return if you are feeling worse, or not improving as expected.  ? ? ? ?ED Prescriptions   ? ? Medication Sig Dispense Auth. Provider  ? albuterol (VENTOLIN HFA) 108 (90 Base) MCG/ACT inhaler Inhale 1-2 puffs into the lungs every 4 (four) hours as needed for wheezing or shortness of breath. 1 each Jannifer Franklin, MD  ? azithromycin (ZITHROMAX) 250 MG tablet Take 1 tablet (250 mg total) by mouth daily. Take first 2 tablets together, then 1 every day until finished. 6 tablet Amarie Viles, MD  ? benzonatate (TESSALON) 100 MG capsule Take 2 capsules (200 mg total) by mouth every 8 (eight) hours. 21 capsule Jannifer Franklin, MD  ? ?  ? ?PDMP not reviewed this encounter. ?  ?Jannifer Franklin, MD ?01/11/22 1056 ? ?

## 2022-01-11 NOTE — Discharge Instructions (Signed)
You were seen today for upper respiratory symptoms.  ?I have treated you today for bronchitis.  I have sent out an antibiotic, cough pill, and inhaler.  Please take these, and get plenty of rest and fluids.  ?Please return if you are feeling worse, or not improving as expected.  ?

## 2022-03-22 ENCOUNTER — Other Ambulatory Visit: Payer: Self-pay

## 2022-03-22 ENCOUNTER — Ambulatory Visit (HOSPITAL_COMMUNITY)
Admission: EM | Admit: 2022-03-22 | Discharge: 2022-03-22 | Disposition: A | Discharge status: Home / Self Care | Payer: 59 | Attending: Emergency Medicine | Admitting: Emergency Medicine

## 2022-03-22 ENCOUNTER — Ambulatory Visit (INDEPENDENT_AMBULATORY_CARE_PROVIDER_SITE_OTHER): Payer: 59

## 2022-03-22 ENCOUNTER — Encounter (HOSPITAL_COMMUNITY): Payer: Self-pay | Admitting: *Deleted

## 2022-03-22 DIAGNOSIS — R059 Cough, unspecified: Secondary | ICD-10-CM | POA: Diagnosis not present

## 2022-03-22 DIAGNOSIS — R0989 Other specified symptoms and signs involving the circulatory and respiratory systems: Secondary | ICD-10-CM

## 2022-03-22 DIAGNOSIS — J209 Acute bronchitis, unspecified: Secondary | ICD-10-CM | POA: Diagnosis not present

## 2022-03-22 DIAGNOSIS — R051 Acute cough: Secondary | ICD-10-CM

## 2022-03-22 MED ORDER — ALBUTEROL SULFATE HFA 108 (90 BASE) MCG/ACT IN AERS: 1.0000 | INHALATION_SPRAY | RESPIRATORY_TRACT | 2 refills | Status: AC | PRN

## 2022-03-22 MED ORDER — DOXYCYCLINE HYCLATE 100 MG PO CAPS: 100.0000 mg | ORAL_CAPSULE | Freq: Two times a day (BID) | ORAL | 0 refills | Status: AC

## 2022-03-22 MED ORDER — PREDNISONE 20 MG PO TABS: 40.0000 mg | ORAL_TABLET | Freq: Every day | ORAL | 0 refills | Status: AC

## 2022-03-22 NOTE — ED Provider Notes (Signed)
[facility]    CSN: [id number] Arrival date & time: 03/22/22  1225      History   Chief Complaint Chief Complaint  Patient presents with   Cough    HPI Dustin Kline is a 26 y.o. male.  Has 2-week history of productive cough.  Reports green and yellow mucus production.  No blood.  Congestion and runny nose as well.  Has tried all kinds of cough medicine and congestion treatment with no relief.  History of pneumonia per patient.  Also history of asthma but has not needed inhaler since he was very young.  No exacerbation history. Denies fevers, night sweats.  No shortness of breath or trouble breathing.  No abdominal pain, vomiting/diarrhea, nausea.  Denies chest pain but does have some rib tenderness from coughing.  History of broken ribs as well.  Current cigarette smoker, also vapes, is trying to cut back. No recent travel. No known sick contacts.  Past Medical History:  Diagnosis Date   Asthma    Attention deficit disorder     Patient Active Problem List   Diagnosis Date Noted   ATTENTION DEFICIT, W/HYPERACTIVITY 11/14/2006   RHINITIS, ALLERGIC 11/14/2006   ASTHMA, UNSPECIFIED 11/14/2006    History reviewed. No pertinent surgical history.     Home Medications    Prior to Admission medications   Medication Sig Start Date End Date Taking? Authorizing Provider  doxycycline (VIBRAMYCIN) 100 MG capsule Take 1 capsule (100 mg total) by mouth 2 (two) times daily for 5 days. 03/22/22 03/27/22 Yes Cannen Dupras, Lurena Joiner, PA-C  predniSONE (DELTASONE) 20 MG tablet Take 2 tablets (40 mg total) by mouth daily for 5 days. 03/22/22 03/27/22 Yes Demetra Moya, Lurena Joiner, PA-C  albuterol (VENTOLIN HFA) 108 (90 Base) MCG/ACT inhaler Inhale 1-2 puffs into the lungs every 4 (four) hours as needed for wheezing or shortness of breath. 03/22/22   Rayansh Herbst, Lurena Joiner, PA-C  ARIPiprazole (ABILIFY) 20 MG tablet Take one tablet by mouth daily 11/23/11   Pattricia Boss, PA-C  atomoxetine (STRATTERA) 40 MG  capsule Take 1 capsule (40 mg total) by mouth daily. 10/28/11 10/27/12  Elvina Sidle, MD    Family History Family History  Problem Relation Age of Onset   Arthritis Mother     Social History Social History   Tobacco Use   Smoking status: Every Day    Types: Cigarettes   Smokeless tobacco: Current  Substance Use Topics   Alcohol use: No   Drug use: No     Allergies   Divalproex sodium   Review of Systems Review of Systems  Respiratory:  Positive for cough.    Per HPI  Physical Exam Triage Vital Signs ED Triage Vitals  Enc Vitals Group     BP 03/22/22 1246 111/65     Pulse Rate 03/22/22 1246 93     Resp 03/22/22 1246 18     Temp 03/22/22 1246 98.8 F (37.1 C)     Temp src --      SpO2 03/22/22 1246 98 %     Weight --      Height --      Head Circumference --      Peak Flow --      Pain Score 03/22/22 1245 0     Pain Loc --      Pain Edu? --      Excl. in GC? --    No data found.  Updated Vital Signs BP 111/65   Pulse 93  Temp 98.8 F (37.1 C)   Resp 18   SpO2 98%    Physical Exam Vitals and nursing note reviewed.  Constitutional:      General: He is not in acute distress.    Appearance: Normal appearance. He is not toxic-appearing.  HENT:     Mouth/Throat:     Pharynx: Oropharynx is clear.  Eyes:     Conjunctiva/sclera: Conjunctivae normal.  Cardiovascular:     Rate and Rhythm: Normal rate and regular rhythm.     Heart sounds: Normal heart sounds.  Pulmonary:     Effort: Pulmonary effort is normal.     Breath sounds: Wheezing, rhonchi and rales present.     Comments: All lung fields Abdominal:     Tenderness: There is no abdominal tenderness.  Musculoskeletal:        General: Normal range of motion.  Neurological:     Mental Status: He is alert and oriented to person, place, and time.     Gait: Gait normal.      UC Treatments / Results  Labs (all labs ordered are listed, but only abnormal results are displayed) Labs  Reviewed - No data to display  EKG  Radiology DG Chest 2 View  Result Date: 03/22/2022 CLINICAL DATA:  cough x 2 weeks, rales EXAM: CHEST - 2 VIEW COMPARISON:  Radiograph 10/27/2013 FINDINGS: Unchanged mass on silhouette. There is no focal airspace disease. There is no pleural effusion. No pneumothorax. There is no acute osseous abnormality. IMPRESSION: No evidence of acute cardiopulmonary disease. Electronically Signed   By: Caprice Renshaw M.D.   On: 03/22/2022 13:45    Procedures Procedures  Medications Ordered in UC Medications - No data to display  Initial Impression / Assessment and Plan / UC Course  I have reviewed the triage vital signs and the nursing notes.  Pertinent labs & imaging results that were available during my care of the patient were reviewed by me and considered in my medical decision making (see chart for details).  Chest x-ray negative.  Due to duration of symptoms plus smoker we will treat with Doxy twice daily for 5 days.  Additionally oral prednisone 40 mg with breakfast for the next 5 days. Refilled inhaler. Recommend decreasing tobacco use.  Understands vaping is also not good for the lungs. Added to primary care assistance list, recommend follow-up with them.  Understands he can return to urgent care if symptoms are persistent, emergency department if anything gets worse. Return precautions discussed. Patient agrees to plan and is discharged in stable condition.  Final Clinical Impressions(s) / UC Diagnoses   Final diagnoses:  Acute cough     Discharge Instructions      Please take medication as prescribed. Take the antibiotic with food.  Someone will reach out to you regarding establishing with a primary care provider.   Please go to the emergency department if symptoms worsen.      ED Prescriptions     Medication Sig Dispense Auth. Provider   albuterol (VENTOLIN HFA) 108 (90 Base) MCG/ACT inhaler Inhale 1-2 puffs into the lungs every 4 (four)  hours as needed for wheezing or shortness of breath. 2 each Isahia Hollerbach, PA-C   doxycycline (VIBRAMYCIN) 100 MG capsule Take 1 capsule (100 mg total) by mouth 2 (two) times daily for 5 days. 10 capsule Billal Rollo, PA-C   predniSONE (DELTASONE) 20 MG tablet Take 2 tablets (40 mg total) by mouth daily for 5 days. 10 tablet Keshanna Riso, Lurena Joiner, PA-C  PDMP not reviewed this encounter.   Cythnia Osmun, Lurena Joiner, [place] 03/22/22 1354

## 2022-03-22 NOTE — ED Triage Notes (Signed)
Pt reports a cough for 11/2 weeks.

## 2022-03-22 NOTE — Discharge Instructions (Addendum)
Please take medication as prescribed. Take the antibiotic with food.  Someone will reach out to you regarding establishing with a primary care provider.   Please go to the emergency department if symptoms worsen.
